# Patient Record
Sex: Female | Born: 1980 | Race: Black or African American | Hispanic: No | Smoking: Never smoker
Health system: Southern US, Community
[De-identification: ages and names within clinical notes are randomized; demographics above are authoritative.]

## PROBLEM LIST (undated history)

## (undated) DIAGNOSIS — J302 Other seasonal allergic rhinitis: Secondary | ICD-10-CM

## (undated) HISTORY — PX: OTHER SURGICAL HISTORY: SHX169

## (undated) HISTORY — PX: HERNIA REPAIR: SHX51

---

## 2005-04-06 ENCOUNTER — Ambulatory Visit (HOSPITAL_COMMUNITY): Admission: RE | Admit: 2005-04-06 | Discharge: 2005-04-06 | Payer: Self-pay | Admitting: Orthopedic Surgery

## 2011-04-23 ENCOUNTER — Encounter: Payer: Self-pay | Admitting: Emergency Medicine

## 2011-04-23 ENCOUNTER — Emergency Department (HOSPITAL_BASED_OUTPATIENT_CLINIC_OR_DEPARTMENT_OTHER)
Admission: EM | Admit: 2011-04-23 | Discharge: 2011-04-23 | Disposition: A | Discharge status: Home / Self Care | Payer: BC Managed Care – PPO | Attending: Emergency Medicine | Admitting: Emergency Medicine

## 2011-04-23 DIAGNOSIS — J029 Acute pharyngitis, unspecified: Secondary | ICD-10-CM | POA: Insufficient documentation

## 2011-04-23 DIAGNOSIS — J069 Acute upper respiratory infection, unspecified: Secondary | ICD-10-CM

## 2011-04-23 DIAGNOSIS — H9209 Otalgia, unspecified ear: Secondary | ICD-10-CM | POA: Insufficient documentation

## 2011-04-23 MED ORDER — OXYCODONE-ACETAMINOPHEN 5-325 MG PO TABS
1.0000 | ORAL_TABLET | Freq: Once | ORAL | Status: AC
  Administered 2011-04-23: 1 via ORAL
  Filled 2011-04-23 (×2): qty 1

## 2011-04-23 MED ORDER — IBUPROFEN 800 MG PO TABS
800.0000 mg | ORAL_TABLET | Freq: Once | ORAL | Status: AC
  Administered 2011-04-23: 800 mg via ORAL
  Filled 2011-04-23 (×2): qty 1

## 2011-04-23 NOTE — ED Provider Notes (Signed)
History     CSN: [id number] Arrival date & time: 04/23/2011  1:06 PM Pt seen at 1322 Chief Complaint  Patient presents with  . Otalgia    earache with sore throat and chest congestion    (Consider location/radiation/quality/duration/timing/severity/associated sxs/prior treatment) Patient is a 30 y.o. female presenting with ear pain. The history is provided by the patient.  Otalgia This is a new problem. The current episode started 2 days ago. There is pain in both ears. The problem has been gradually worsening. Associated symptoms include sore throat and cough.  pt reports cough/congestion/sore throat and ear pain  History reviewed. No pertinent past medical history.  Past Surgical History  Procedure Date  . Knee sugery     History reviewed. No pertinent family history.  History  Substance Use Topics  . Smoking status: Never Smoker   . Smokeless tobacco: Not on file  . Alcohol Use: No    OB History    Grav Para Term Preterm Abortions TAB SAB Ect Mult Living                  Review of Systems  HENT: Positive for ear pain and sore throat.   Respiratory: Positive for cough.     Allergies  Review of patient's allergies indicates no known allergies.  Home Medications  No current outpatient prescriptions on file.  BP 122/74  Pulse 66  Temp 98.9 F (37.2 C)  Resp 18  SpO2 100%  LMP 03/31/2011  Physical Exam CONSTITUTIONAL: Well developed/well nourished HEAD AND FACE: Normocephalic/atraumatic EYES: EOMI/PERRL ENMT: Mucous membranes moist, uvula midline, pharynx normal Left TM/right TM normal NECK: supple no meningeal signs CV: S1/S2 noted, no murmurs/rubs/gallops noted LUNGS: Lungs are clear to auscultation bilaterally, no apparent distress ABDOMEN: soft, nontender, no rebound or guarding NEURO: Pt is awake/alert, moves all extremitiesx4 EXTREMITIES: pulses normal, full ROM SKIN: warm, color normal   ED Course  Procedures (including critical care  time)   Labs Reviewed  RAPID STREP SCREEN     1. Upper respiratory infection   2. Pharyngitis       MDM  Nursing notes reviewed and considered in documentation All labs/vitals reviewed and considered         Joya Gaskins, MD 04/23/11 1404

## 2011-04-23 NOTE — ED Notes (Signed)
Pt presents with earache sore throat and chest congestion

## 2011-04-23 NOTE — ED Notes (Signed)
Care Plan and hydration reviewed with Pt and family at side

## 2012-05-30 ENCOUNTER — Other Ambulatory Visit (HOSPITAL_COMMUNITY)
Admission: RE | Admit: 2012-05-30 | Discharge: 2012-05-30 | Disposition: A | Discharge status: Home / Self Care | Payer: Self-pay | Source: Ambulatory Visit | Attending: Obstetrics and Gynecology | Admitting: Obstetrics and Gynecology

## 2012-05-30 DIAGNOSIS — Z01419 Encounter for gynecological examination (general) (routine) without abnormal findings: Secondary | ICD-10-CM | POA: Insufficient documentation

## 2012-05-30 DIAGNOSIS — Z1151 Encounter for screening for human papillomavirus (HPV): Secondary | ICD-10-CM | POA: Insufficient documentation

## 2012-05-30 DIAGNOSIS — R87810 Cervical high risk human papillomavirus (HPV) DNA test positive: Secondary | ICD-10-CM | POA: Insufficient documentation

## 2013-06-04 ENCOUNTER — Other Ambulatory Visit: Payer: Self-pay | Admitting: Obstetrics and Gynecology

## 2013-06-04 ENCOUNTER — Other Ambulatory Visit (HOSPITAL_COMMUNITY)
Admission: RE | Admit: 2013-06-04 | Discharge: 2013-06-04 | Disposition: A | Discharge status: Home / Self Care | Payer: BC Managed Care – PPO | Source: Ambulatory Visit | Attending: Obstetrics and Gynecology | Admitting: Obstetrics and Gynecology

## 2013-06-04 DIAGNOSIS — R87810 Cervical high risk human papillomavirus (HPV) DNA test positive: Secondary | ICD-10-CM | POA: Insufficient documentation

## 2013-06-04 DIAGNOSIS — Z1151 Encounter for screening for human papillomavirus (HPV): Secondary | ICD-10-CM | POA: Insufficient documentation

## 2013-06-04 DIAGNOSIS — Z124 Encounter for screening for malignant neoplasm of cervix: Secondary | ICD-10-CM | POA: Insufficient documentation

## 2015-12-27 ENCOUNTER — Telehealth (HOSPITAL_COMMUNITY): Payer: Self-pay | Admitting: *Deleted

## 2015-12-27 NOTE — Telephone Encounter (Signed)
Called patient to extend an invitation for a [employer] on June 21. Left message and asked for a return phone call.

## 2016-09-09 ENCOUNTER — Encounter (HOSPITAL_BASED_OUTPATIENT_CLINIC_OR_DEPARTMENT_OTHER): Payer: Self-pay | Admitting: Emergency Medicine

## 2016-09-09 ENCOUNTER — Emergency Department (HOSPITAL_BASED_OUTPATIENT_CLINIC_OR_DEPARTMENT_OTHER)
Admission: EM | Admit: 2016-09-09 | Discharge: 2016-09-09 | Disposition: A | Discharge status: Home / Self Care | Payer: BLUE CROSS/BLUE SHIELD | Attending: Physician Assistant | Admitting: Physician Assistant

## 2016-09-09 DIAGNOSIS — R197 Diarrhea, unspecified: Secondary | ICD-10-CM | POA: Insufficient documentation

## 2016-09-09 DIAGNOSIS — R112 Nausea with vomiting, unspecified: Secondary | ICD-10-CM | POA: Diagnosis present

## 2016-09-09 DIAGNOSIS — R109 Unspecified abdominal pain: Secondary | ICD-10-CM | POA: Diagnosis not present

## 2016-09-09 LAB — COMPREHENSIVE METABOLIC PANEL
ALBUMIN: 3.8 g/dL (ref 3.5–5.0)
ALT: 39 U/L (ref 14–54)
AST: 28 U/L (ref 15–41)
Alkaline Phosphatase: 58 U/L (ref 38–126)
Anion gap: 8 (ref 5–15)
BUN: 23 mg/dL — AB (ref 6–20)
CHLORIDE: 104 mmol/L (ref 101–111)
CO2: 24 mmol/L (ref 22–32)
CREATININE: 1.01 mg/dL — AB (ref 0.44–1.00)
Calcium: 8.7 mg/dL — ABNORMAL LOW (ref 8.9–10.3)
GFR calc Af Amer: >60 mL/min (ref >60)
GLUCOSE: 103 mg/dL — AB (ref 65–99)
Potassium: 3.5 mmol/L (ref 3.5–5.1)
Sodium: 136 mmol/L (ref 135–145)
Total Bilirubin: 1.1 mg/dL (ref 0.3–1.2)
Total Protein: 8.6 g/dL — ABNORMAL HIGH (ref 6.5–8.1)

## 2016-09-09 LAB — URINALYSIS, ROUTINE W REFLEX MICROSCOPIC
GLUCOSE, UA: NEGATIVE mg/dL
Hgb urine dipstick: NEGATIVE
KETONES UR: 15 mg/dL — AB
NITRITE: NEGATIVE
PROTEIN: NEGATIVE mg/dL
Specific Gravity, Urine: 1.03 (ref 1.005–1.030)
pH: 6 (ref 5.0–8.0)

## 2016-09-09 LAB — CBC
HCT: 43.4 % (ref 36.0–46.0)
Hemoglobin: 15.2 g/dL — ABNORMAL HIGH (ref 12.0–15.0)
MCH: 29.7 pg (ref 26.0–34.0)
MCHC: 35 g/dL (ref 30.0–36.0)
MCV: 84.8 fL (ref 78.0–100.0)
Platelets: 281 10*3/uL (ref 150–400)
RBC: 5.12 MIL/uL — ABNORMAL HIGH (ref 3.87–5.11)
RDW: 12.8 % (ref 11.5–15.5)
WBC: 7.3 10*3/uL (ref 4.0–10.5)

## 2016-09-09 LAB — URINALYSIS, MICROSCOPIC (REFLEX): RBC / HPF: NONE SEEN RBC/hpf (ref 0–5)

## 2016-09-09 LAB — LIPASE, BLOOD: LIPASE: 22 U/L (ref 11–51)

## 2016-09-09 LAB — PREGNANCY, URINE: Preg Test, Ur: NEGATIVE

## 2016-09-09 MED ORDER — ONDANSETRON HCL 4 MG PO TABS: 4.0000 mg | ORAL_TABLET | Freq: Three times a day (TID) | ORAL | 0 refills | Status: AC | PRN

## 2016-09-09 MED ORDER — SODIUM CHLORIDE 0.9 % IV BOLUS (SEPSIS)
1000.0000 mL | Freq: Once | INTRAVENOUS | Status: AC
  Administered 2016-09-09: 1000 mL via INTRAVENOUS

## 2016-09-09 MED ORDER — ONDANSETRON HCL 4 MG/2ML IJ SOLN
4.0000 mg | Freq: Once | INTRAMUSCULAR | Status: AC | PRN
  Administered 2016-09-09: 4 mg via INTRAVENOUS
  Filled 2016-09-09: qty 2

## 2016-09-09 NOTE — ED Notes (Signed)
Patient denies pain and is resting comfortably.  

## 2016-09-09 NOTE — ED Triage Notes (Signed)
Woke up this am with n/v/d and abd cramping

## 2016-09-09 NOTE — ED Provider Notes (Signed)
[facility]   CSN: [id number] Arrival date & time: 09/09/16  1243  By signing my name below, I, Modena JanskyAlbert Thayil, attest that this documentation has been prepared under the direction and in the presence of Courteney Randall AnLyn Mackuen, MD. Electronically Signed: Modena JanskyAlbert Thayil, Scribe. 09/09/2016. 3:12 PM.  History   Chief Complaint Chief Complaint  Patient presents with  . Abdominal Pain  . Emesis   The history is provided by the patient. No language interpreter was used.   HPI Comments: Sharon Hampton is a 36 y.o. female who presents to the Emergency Department complaining of intermittent vomiting that started this morning. She had a sudden onset of symptoms from suspected food last night. No modifying factors. She reports associated nausea, diarrhea, and abdominal pain (cramping sensation). She denies any other complaints.   History reviewed. No pertinent past medical history.  There are no active problems to display for this patient.   Past Surgical History:  Procedure Laterality Date  . knee sugery      OB History    No data available       Home Medications    Prior to Admission medications   Not on File    Family History No family history on file.  Social History Social History  Substance Use Topics  . Smoking status: Never Smoker  . Smokeless tobacco: Never Used  . Alcohol use No     Allergies   Erythromycin   Review of Systems Review of Systems  Gastrointestinal: Positive for abdominal pain, diarrhea, nausea and vomiting.  All other systems reviewed and are negative.    Physical Exam Updated Vital Signs BP 139/74 (BP Location: Right Arm)   Pulse 83   Temp 98.4 F (36.9 C) (Oral)   Resp 20   Ht 5' 4.5" (1.638 m)   Wt 159 lb (72.1 kg)   LMP 08/23/2016   SpO2 100%   BMI 26.87 kg/m   Physical Exam  Constitutional: She appears well-developed and well-nourished. No distress.  HENT:  Head: Normocephalic.  Eyes:  Conjunctivae are normal.  Neck: Neck supple.  Cardiovascular: Normal rate and regular rhythm.   Pulmonary/Chest: Effort normal.  Abdominal: Soft. She exhibits no distension. There is no tenderness.  Musculoskeletal: Normal range of motion.  Neurological: She is alert.  Skin: Skin is warm and dry.  Psychiatric: She has a normal mood and affect.  Nursing note and vitals reviewed.    ED Treatments / Results  DIAGNOSTIC STUDIES: Oxygen Saturation is 100% on RA, normal by my interpretation.    COORDINATION OF CARE: 3:16 PM- Pt advised of plan for treatment and pt agrees.  Labs (all labs ordered are listed, but only abnormal results are displayed) Labs Reviewed  URINALYSIS, ROUTINE W REFLEX MICROSCOPIC - Abnormal; Notable for the following:       Result Value   Color, Urine AMBER (*)    Bilirubin Urine SMALL (*)    Ketones, ur 15 (*)    Leukocytes, UA SMALL (*)    All other components within normal limits  URINALYSIS, MICROSCOPIC (REFLEX) - Abnormal; Notable for the following:    Bacteria, UA MANY (*)    Squamous Epithelial / LPF 6-30 (*)    All other components within normal limits  PREGNANCY, URINE  LIPASE, BLOOD  COMPREHENSIVE METABOLIC PANEL  CBC    EKG  EKG Interpretation None       Radiology No results found.  Procedures Procedures (including critical care time)  Medications Ordered  in ED Medications  ondansetron (ZOFRAN) injection 4 mg (4 mg Intravenous Given 09/09/16 1511)  sodium chloride 0.9 % bolus 1,000 mL (1,000 mLs Intravenous New Bag/Given 09/09/16 1455)     Initial Impression / Assessment and Plan / ED Course  I have reviewed the triage vital signs and the nursing notes.  Pertinent labs & imaging results that were available during my care of the patient were reviewed by me and considered in my medical decision making (see chart for details).     I personally performed the services described in this documentation, which was scribed in my  presence. The recorded information has been reviewed and is accurate.   Patient is a pleasant 36 year old female who presents with nausea vomiting diarrhea after eating at [facility] last night. Patient's husband has the same thing. I suspect foodborne illness versus viral gastroenteritis. We'll give fluids, Zofran, PO challenge and discharge home with symptomatic care. Patient's physical exam and vital signs are reassuring.   Final Clinical Impressions(s) / ED Diagnoses   Final diagnoses:  None    New Prescriptions New Prescriptions   No medications on file      Courteney Randall An, MD 09/09/16 1534

## 2016-09-09 NOTE — ED Notes (Signed)
Pt able to tolerate water with no n/v. 

## 2017-04-16 ENCOUNTER — Emergency Department (HOSPITAL_BASED_OUTPATIENT_CLINIC_OR_DEPARTMENT_OTHER): Payer: BLUE CROSS/BLUE SHIELD

## 2017-04-16 ENCOUNTER — Encounter (HOSPITAL_BASED_OUTPATIENT_CLINIC_OR_DEPARTMENT_OTHER): Payer: Self-pay | Admitting: Emergency Medicine

## 2017-04-16 ENCOUNTER — Emergency Department (HOSPITAL_BASED_OUTPATIENT_CLINIC_OR_DEPARTMENT_OTHER)
Admission: EM | Admit: 2017-04-16 | Discharge: 2017-04-16 | Disposition: A | Discharge status: Home / Self Care | Payer: BLUE CROSS/BLUE SHIELD | Attending: Emergency Medicine | Admitting: Emergency Medicine

## 2017-04-16 DIAGNOSIS — J029 Acute pharyngitis, unspecified: Secondary | ICD-10-CM | POA: Diagnosis present

## 2017-04-16 DIAGNOSIS — J209 Acute bronchitis, unspecified: Secondary | ICD-10-CM | POA: Insufficient documentation

## 2017-04-16 HISTORY — DX: Other seasonal allergic rhinitis: J30.2

## 2017-04-16 LAB — RAPID STREP SCREEN (MED CTR MEBANE ONLY): Streptococcus, Group A Screen (Direct): NEGATIVE

## 2017-04-16 MED ORDER — IBUPROFEN 400 MG PO TABS
600.0000 mg | ORAL_TABLET | Freq: Once | ORAL | Status: AC
  Administered 2017-04-16: 600 mg via ORAL
  Filled 2017-04-16: qty 1

## 2017-04-16 MED ORDER — DOXYCYCLINE HYCLATE 100 MG PO CAPS: 100.0000 mg | ORAL_CAPSULE | Freq: Two times a day (BID) | ORAL | 0 refills | Status: AC

## 2017-04-16 MED ORDER — ALBUTEROL SULFATE HFA 108 (90 BASE) MCG/ACT IN AERS
2.0000 | INHALATION_SPRAY | Freq: Once | RESPIRATORY_TRACT | Status: AC
  Administered 2017-04-16: 2 via RESPIRATORY_TRACT
  Filled 2017-04-16: qty 6.7

## 2017-04-16 MED FILL — DOXYCYCLINE HYC 100 MG CAP: 100 | 7 days supply | Qty: 14 | Fill #0

## 2017-04-16 NOTE — ED Notes (Signed)
ED Provider at bedside. 

## 2017-04-16 NOTE — ED Notes (Signed)
Patient transported to X-ray 

## 2017-04-16 NOTE — ED Provider Notes (Signed)
[facility]   CSN: [id number] Arrival date & time: 04/16/17  0901     History   Chief Complaint Chief Complaint  Patient presents with  . Sore Throat    HPI Sharon Hampton is a 36 y.o. female.  HPI Patient is a 36 year old female presents with ongoing discomfort in her throat over the past 7-9 days which she now feels like his worsening.  She's also having some cough and pleuritic upper right chest pain.  No fevers or chills.  No neck stiffness.  She is able to drink and swallow.  No shortness of breath or difficulty breathing.  Symptoms are mild in severity.  No other complaints.   Past Medical History:  Diagnosis Date  . Seasonal allergies     There are no active problems to display for this patient.   Past Surgical History:  Procedure Laterality Date  . knee sugery      OB History    No data available       Home Medications    Prior to Admission medications   Medication Sig Start Date End Date Taking? Authorizing Provider  doxycycline (VIBRAMYCIN) 100 MG capsule Take 1 capsule (100 mg total) by mouth 2 (two) times daily. 04/16/17   Azalia Bilis, MD  ondansetron (ZOFRAN) 4 MG tablet Take 1 tablet (4 mg total) by mouth every 8 (eight) hours as needed for nausea or vomiting. 09/09/16   Mackuen, Cindee Salt, MD    Family History No family history on file.  Social History Social History  Substance Use Topics  . Smoking status: Never Smoker  . Smokeless tobacco: Never Used  . Alcohol use No     Allergies   Erythromycin   Review of Systems Review of Systems  All other systems reviewed and are negative.    Physical Exam Updated Vital Signs BP 121/62 (BP Location: Right Arm)   Pulse 72   Temp 98.3 F (36.8 C) (Oral)   Resp 18   Ht  (1.626 m)   Wt 70.3 kg (155 lb)   LMP 03/26/2017   SpO2 97%   BMI 26.61 kg/m   Physical Exam  Constitutional: She is oriented to person, place, and time. She appears  well-developed and well-nourished.  HENT:  Head: Normocephalic.  Uvula midline.  Mild erythema posterior pharynx.  Tolerating secretions.  Oral airway patent.  Mild tonsillar swelling without exudates.  Eyes: EOM are normal.  Neck: Normal range of motion. Neck supple. No tracheal deviation present. No thyromegaly present.  No meningeal signs  Pulmonary/Chest: Effort normal.  Abdominal: She exhibits no distension.  Musculoskeletal: Normal range of motion.  Lymphadenopathy:    She has no cervical adenopathy.  Neurological: She is alert and oriented to person, place, and time.  Psychiatric: She has a normal mood and affect.  Nursing note and vitals reviewed.    ED Treatments / Results  Labs (all labs ordered are listed, but only abnormal results are displayed) Labs Reviewed  RAPID STREP SCREEN (NOT AT [facility])  CULTURE, GROUP A STREP [facility])    EKG  EKG Interpretation None       Radiology Dg Chest 2 View  Result Date: 04/16/2017 CLINICAL DATA:  Chest pain. EXAM: CHEST  2 VIEW COMPARISON:  None. FINDINGS: The heart size and mediastinal contours are within normal limits. Both lungs are clear. No pneumothorax or pleural effusion is noted. The visualized skeletal structures are unremarkable. IMPRESSION: No active cardiopulmonary disease. Electronically Signed  By: Lupita Raider, M.D.   On: 04/16/2017 09:35    Procedures Procedures (including critical care time)  Medications Ordered in ED Medications  albuterol (PROVENTIL HFA;VENTOLIN HFA) 108 (90 Base) MCG/ACT inhaler 2 puff (2 puffs Inhalation Given 04/16/17 0932)  ibuprofen (ADVIL,MOTRIN) tablet 600 mg (600 mg Oral Given 04/16/17 0924)     Initial Impression / Assessment and Plan / ED Course  I have reviewed the triage vital signs and the nursing notes.  Pertinent labs & imaging results that were available during my care of the patient were reviewed by me and considered in my medical decision making (see chart for  details).     Likely viral process however given persistence of symptoms we will place on course of antibiotics.  Patient understands return to the ER for new or worsening symptoms  Final Clinical Impressions(s) / ED Diagnoses   Final diagnoses:  Acute bronchitis, unspecified organism    New Prescriptions Discharge Medication List as of 04/16/2017  9:57 AM    START taking these medications   Details  doxycycline (VIBRAMYCIN) 100 MG capsule Take 1 capsule (100 mg total) by mouth 2 (two) times daily., Starting Mon 04/16/2017, Print         Azalia Bilis, MD 04/16/17 1110

## 2017-04-16 NOTE — ED Triage Notes (Signed)
Sore throat x 2 weeks with chest congestion.

## 2017-04-17 LAB — CULTURE, GROUP A STREP (THRC)

## 2017-05-29 ENCOUNTER — Emergency Department (HOSPITAL_BASED_OUTPATIENT_CLINIC_OR_DEPARTMENT_OTHER)
Admission: EM | Admit: 2017-05-29 | Discharge: 2017-05-29 | Disposition: A | Discharge status: Home / Self Care | Payer: BLUE CROSS/BLUE SHIELD | Attending: Emergency Medicine | Admitting: Emergency Medicine

## 2017-05-29 ENCOUNTER — Other Ambulatory Visit: Payer: Self-pay

## 2017-05-29 ENCOUNTER — Encounter (HOSPITAL_BASED_OUTPATIENT_CLINIC_OR_DEPARTMENT_OTHER): Payer: Self-pay | Admitting: *Deleted

## 2017-05-29 DIAGNOSIS — S50862A Insect bite (nonvenomous) of left forearm, initial encounter: Secondary | ICD-10-CM | POA: Insufficient documentation

## 2017-05-29 DIAGNOSIS — Y998 Other external cause status: Secondary | ICD-10-CM | POA: Insufficient documentation

## 2017-05-29 DIAGNOSIS — W57XXXA Bitten or stung by nonvenomous insect and other nonvenomous arthropods, initial encounter: Secondary | ICD-10-CM | POA: Insufficient documentation

## 2017-05-29 DIAGNOSIS — Y929 Unspecified place or not applicable: Secondary | ICD-10-CM | POA: Diagnosis not present

## 2017-05-29 DIAGNOSIS — Y9389 Activity, other specified: Secondary | ICD-10-CM | POA: Diagnosis not present

## 2017-05-29 MED ORDER — EPINEPHRINE 0.15 MG/0.15ML IJ SOAJ: 0.1500 mg | INTRAMUSCULAR | 0 refills | Status: AC | PRN

## 2017-05-29 MED ORDER — CEPHALEXIN 500 MG PO CAPS: 500.0000 mg | ORAL_CAPSULE | Freq: Four times a day (QID) | ORAL | 0 refills | Status: DC

## 2017-05-29 MED FILL — CEPHALEXIN 500 MG CAPSULE: 500 | 5 days supply | Qty: 20 | Fill #0

## 2017-05-29 NOTE — ED Provider Notes (Signed)
[facility] EMERGENCY DEPARTMENT Provider Note   CSN: [id number] Arrival date & time: 05/29/17  1246     History   Chief Complaint Chief Complaint  Patient presents with  . Insect Bite    HPI Sharon FredricksonJessica Hampton is a 36 y.o. 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  Patient reports she was stung around 2 PM yesterday by a flying insect.  She is unsure what it was.  She smacked it on her arm.  She initially had about a dime size area of swelling and redness, but has increased over the past 24 hours.  The area has been warm she is having burning pain and itching.  She is taking Benadryl and use an anti-itch cream over-the-counter without significant relief.  She denies any fevers, difficulty breathing, swelling of her lips, tongue, throat.  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  She does not have an EpiPen anymore.  HPI  Past Medical History:  Diagnosis Date  . Seasonal allergies     There are no active problems to display for this patient.   Past Surgical History:  Procedure Laterality Date  . knee sugery      OB History    No data available       Home Medications    Prior to Admission medications   Medication Sig Start Date End Date Taking? Authorizing Provider  cephALEXin (KEFLEX) 500 MG capsule Take 1 capsule (500 mg total) 4 (four) times daily by mouth. 05/29/17   Conner Muegge, Waylan BogaAlexandra M, PA-C  doxycycline (VIBRAMYCIN) 100 MG capsule Take 1 capsule (100 mg total) by mouth 2 (two) times daily. 04/16/17   Azalia Bilisampos, Kevin, MD  EPINEPHrine 0.15 MG/0.15ML IJ injection Inject 0.15 mLs (0.15 mg total) as needed into the muscle for anaphylaxis. 05/29/17   Karlita Lichtman, Waylan BogaAlexandra M, PA-C  ondansetron (ZOFRAN) 4 MG tablet Take 1 tablet (4 mg total) by mouth every 8 (eight) hours as needed for nausea or vomiting. 09/09/16   Mackuen, Cindee Saltourteney  Lyn, MD    Family History No family history on file.  Social History Social History   Tobacco Use  . Smoking status: Never Smoker  . Smokeless tobacco: Never Used  Substance Use Topics  . Alcohol use: No  . Drug use: No     Allergies   Erythromycin   Review of Systems Review of Systems  Constitutional: Negative for fever.  HENT: Negative for facial swelling, sore throat and trouble swallowing.   Respiratory: Negative for shortness of breath.   Skin: Positive for color change and rash.     Physical Exam Updated Vital Signs BP 137/85 (BP Location: Right Arm)   Pulse 73   Temp 98.3 F (36.8 C) (Oral)   Resp 18   Ht 5' 4.5" (1.638 m)   Wt 70.3 kg (155 lb)   LMP 05/16/2017   SpO2 100%   BMI 26.19 kg/m   Physical Exam  Constitutional: She appears well-developed and well-nourished. No distress.  HENT:  Head: Normocephalic and atraumatic.  Mouth/Throat: Oropharynx is clear and moist. No oropharyngeal exudate.  Eyes: Conjunctivae are normal. Pupils are equal, round, and reactive to light. Right eye exhibits no discharge. Left eye exhibits no discharge. No scleral icterus.  Neck: Normal range of motion. Neck supple. No thyromegaly present.  Cardiovascular: Normal rate, regular rhythm, normal heart sounds  and intact distal pulses. Exam reveals no gallop and no friction rub.  No murmur heard. Pulmonary/Chest: Effort normal and breath sounds normal. No stridor. No respiratory distress. She has no wheezes. She has no rales.  Musculoskeletal: She exhibits no edema.  Lymphadenopathy:    She has no cervical adenopathy.  Neurological: She is alert. Coordination normal.  Skin: Skin is warm and dry. No rash noted. She is not diaphoretic. No pallor.  ~7cm area oval area of erythema, mild edema, and mild tenderness to L volar forearm just distal to elbow; central punctate  Psychiatric: She has a normal mood and affect.  Nursing note and vitals reviewed.    ED Treatments /  Results  Labs (all labs ordered are listed, but only abnormal results are displayed) Labs Reviewed - No data to display  EKG  EKG Interpretation None       Radiology No results found.  Procedures Procedures (including critical care time)  Medications Ordered in ED Medications - No data to display   Initial Impression / Assessment and Plan / ED Course  I have reviewed the triage vital signs and the nursing notes.  Pertinent labs & imaging results that were available during my care of the patient were reviewed by me and considered in my medical decision making (see chart for details).     Patient presentation consistent with insect bite reaction versus early cellulitis. Afebrile. No tachycardia, hypotension or other symptoms suggestive of severe infection. Area has been demarcated and pt advised to follow up for wound check in 2-3 days if not improved, sooner for worsening systemic symptoms, new lymphangitis, or significant spread of erythema past line of demarcation. Will discharge with Keflex and advised to continue Benadryl.  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  She does not have an EpiPen at home and I feel that it is unclear if she has grown out of the allergy.  Return precautions discussed.  Patient understands and agrees with plan.  Patient vitals stable throughout ED course and discharged in satisfactory condition.  Final Clinical Impressions(s) / ED Diagnoses   Final diagnoses:  Insect bite, initial encounter    ED Discharge Orders        Ordered    cephALEXin (KEFLEX) 500 MG capsule  4 times daily     05/29/17 1433    EPINEPHrine 0.15 MG/0.15ML IJ injection  As needed     05/29/17 1433       Zanyla Klebba, Waylan Bogalexandra M, PA-C 05/30/17 1840    Tilden Fossaees, Elizabeth, MD 06/01/17 1006

## 2017-05-29 NOTE — ED Notes (Signed)
Area of erythema marked with skin marker to monitor for signs or worsening.

## 2017-05-29 NOTE — Discharge Instructions (Signed)
Medications: Keflex, EpiPen  Treatment: Take Keflex 4 times daily for 5 days.  Make sure to finish all this medication.  Just in case you may still have a severe allergy to bee stings, have an EpiPen on hand for any further insect bites causing you to have difficulty breathing, swelling of your lips, tongue, throat, or vomiting.  Follow-up: Please return to the emergency department or see your doctor immediately if you develop significant increase in redness spreading past the demarcated area, swelling, drainage, red streaking up the arm.

## 2017-05-29 NOTE — ED Triage Notes (Signed)
States an insect bit her last night and she smacked it. Swelling, redness and hot to touch her left forearm.

## 2018-08-30 IMAGING — CR DG CHEST 2V
2 series · 2 of 2 positions shown · non-contrast
Comparison: None.

CLINICAL DATA: Chest pain.

EXAM:
CHEST  2 VIEW

[w chest pa]
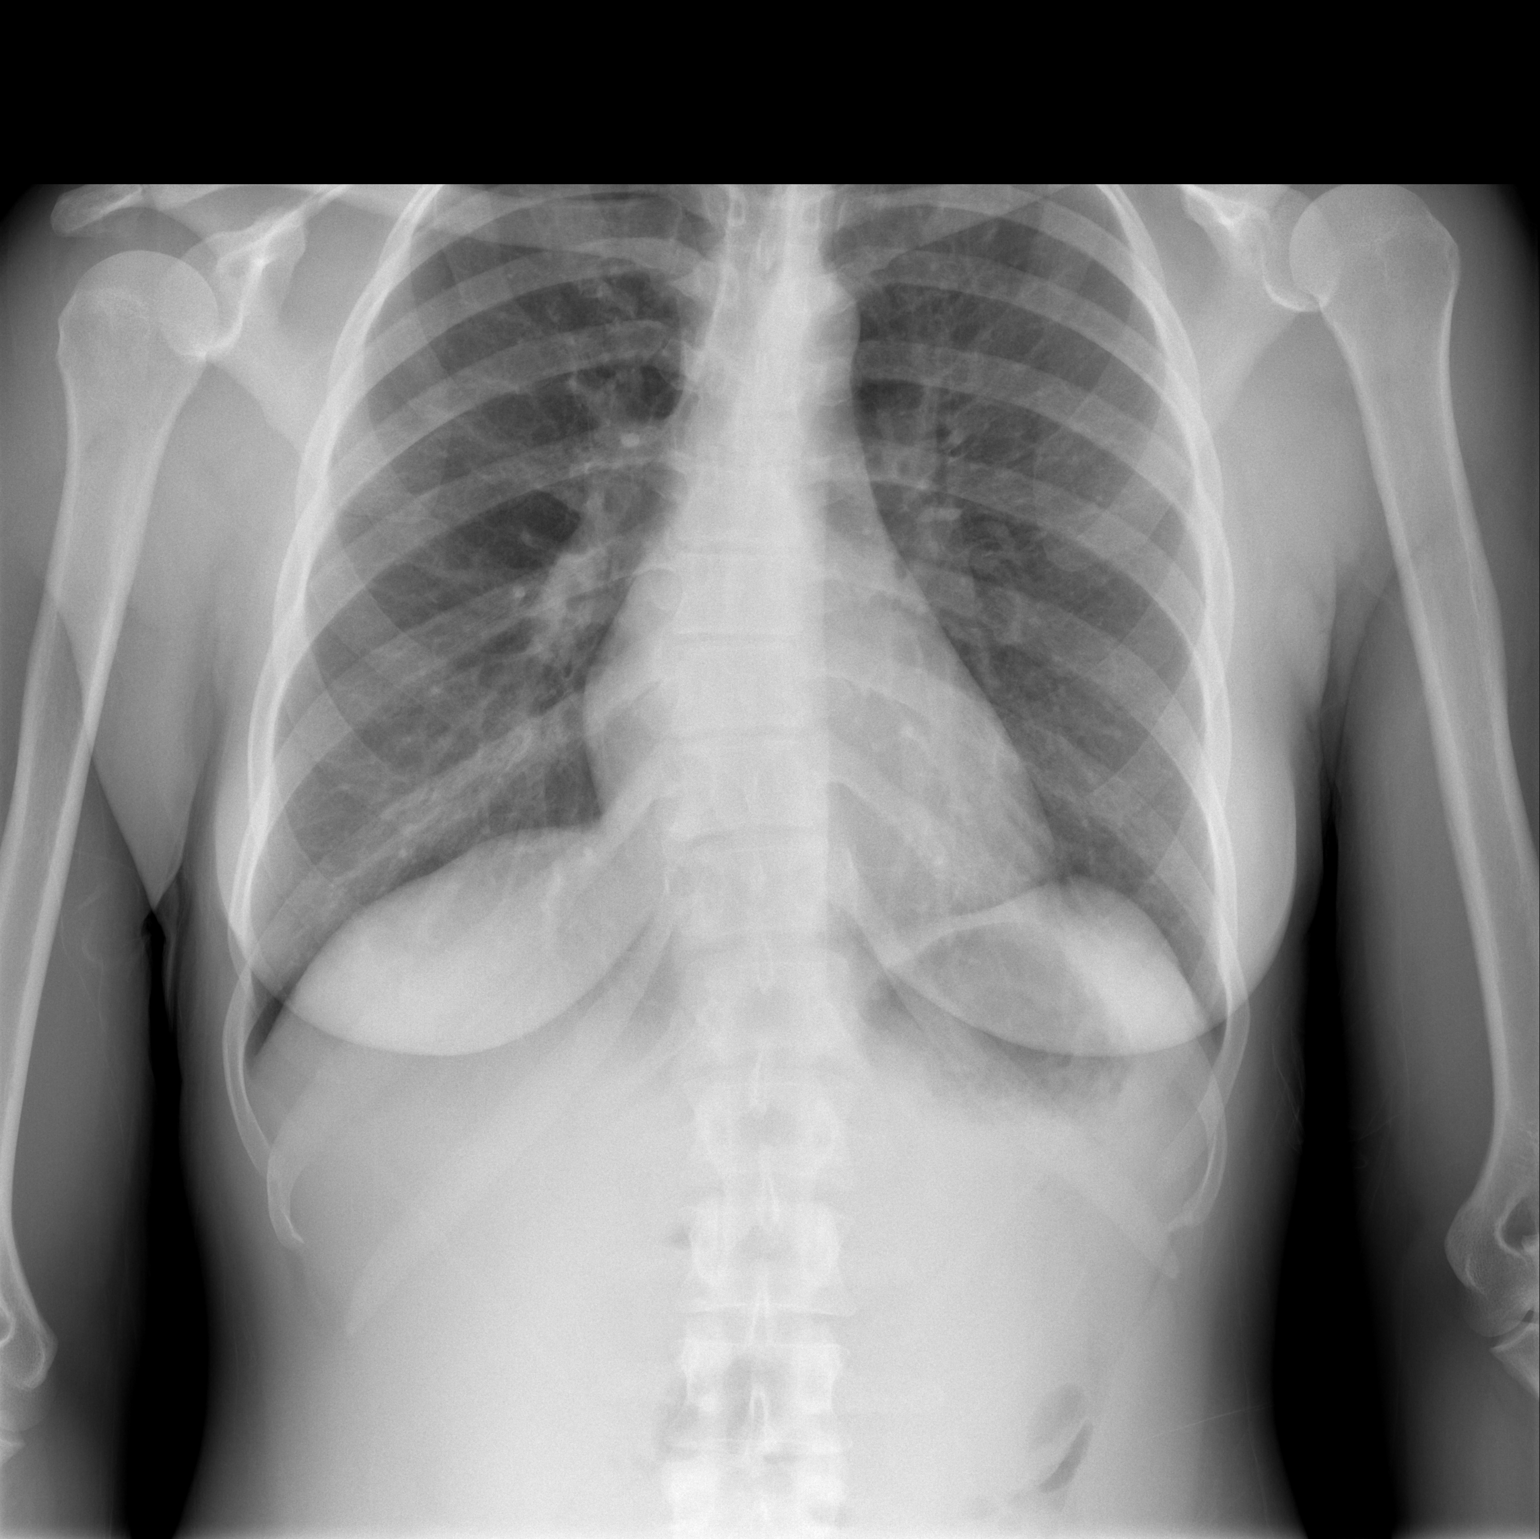

[w chest lat]
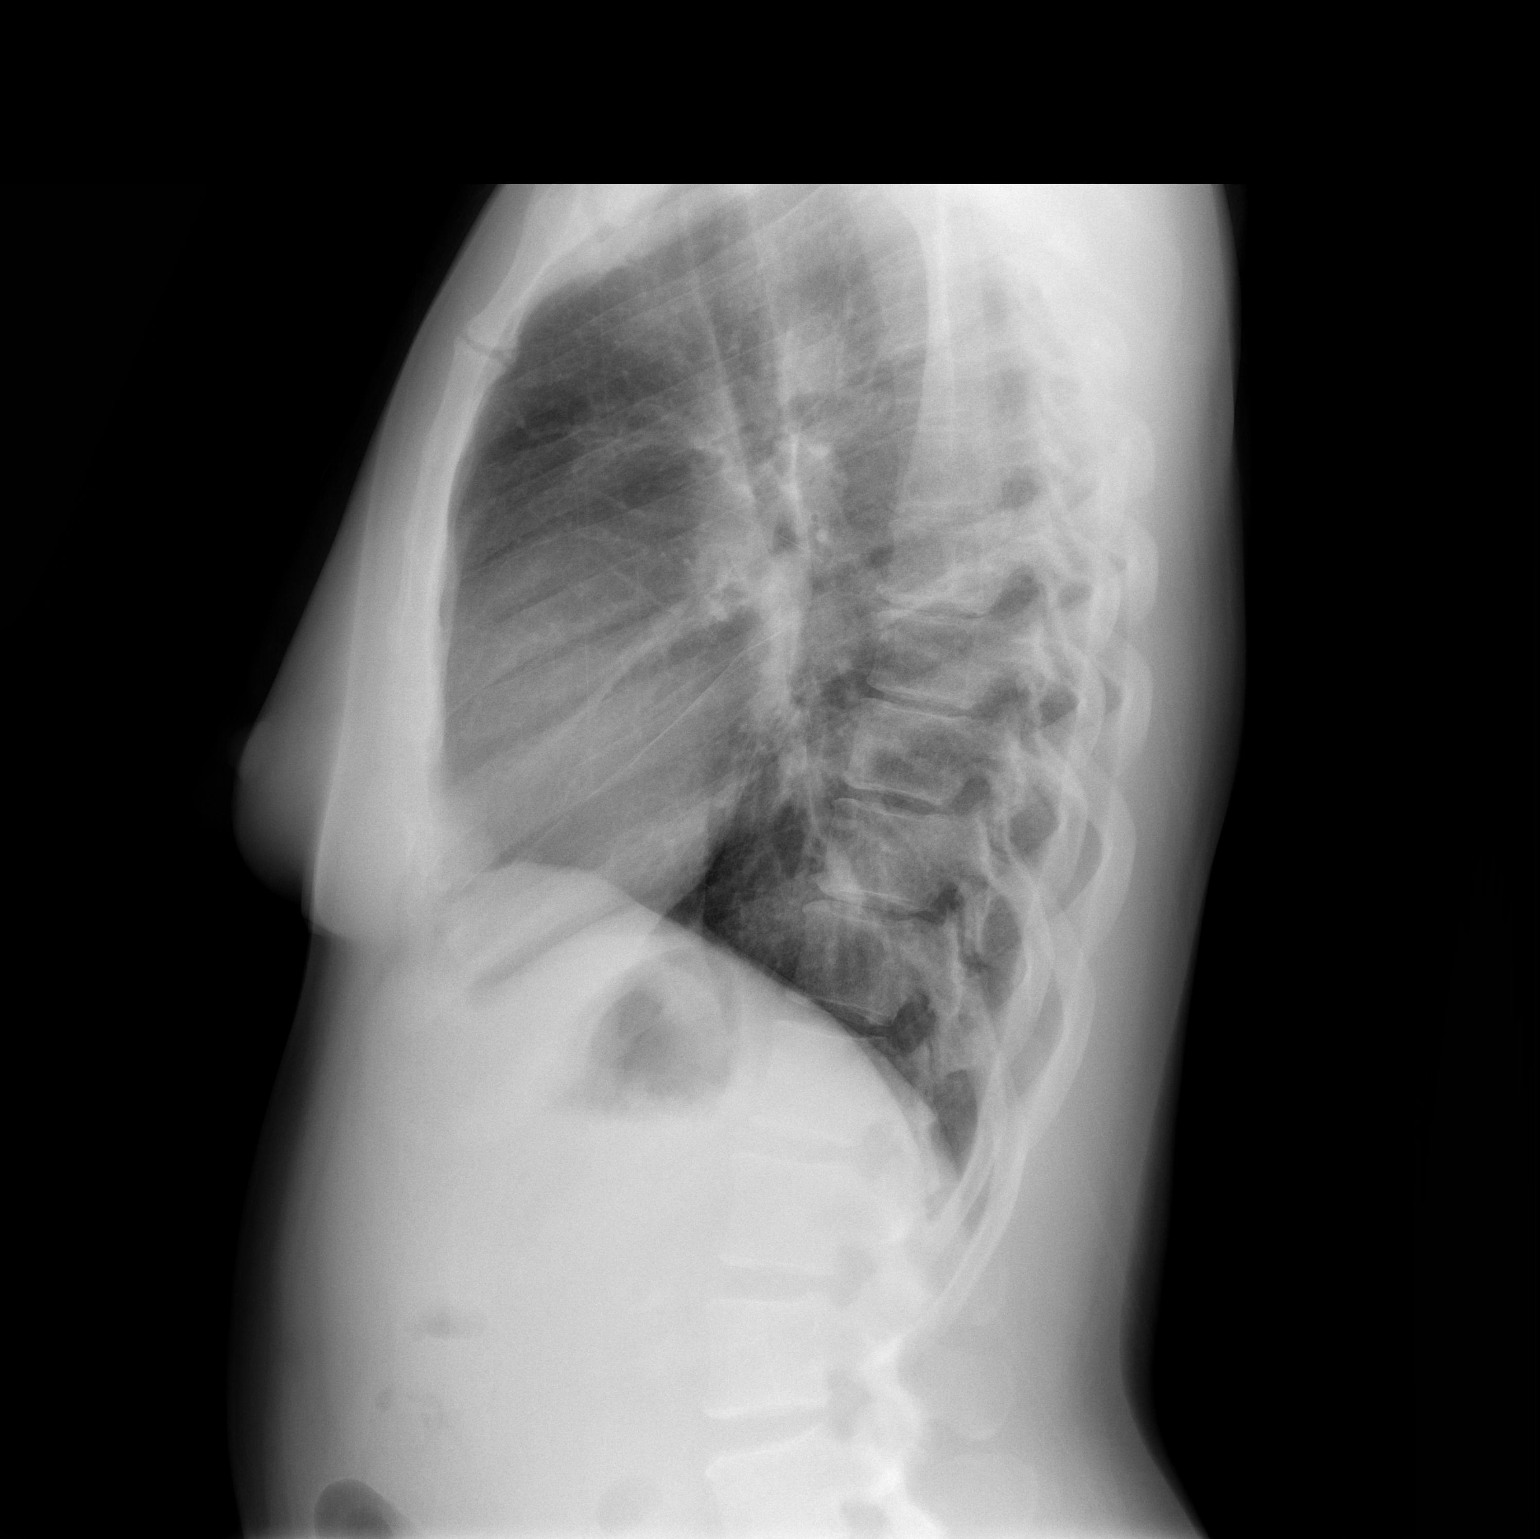

[2 of 2 positions shown; findings below may reference images not displayed]

FINDINGS: The heart size and mediastinal contours are within normal limits.
Both lungs are clear. No pneumothorax or pleural effusion is noted.
The visualized skeletal structures are unremarkable.
IMPRESSION: No active cardiopulmonary disease.

## 2020-12-10 ENCOUNTER — Other Ambulatory Visit: Payer: Self-pay

## 2020-12-10 ENCOUNTER — Encounter (HOSPITAL_BASED_OUTPATIENT_CLINIC_OR_DEPARTMENT_OTHER): Payer: Self-pay

## 2020-12-10 ENCOUNTER — Emergency Department (HOSPITAL_BASED_OUTPATIENT_CLINIC_OR_DEPARTMENT_OTHER)
Admission: EM | Admit: 2020-12-10 | Discharge: 2020-12-10 | Disposition: A | Discharge status: Home / Self Care | Payer: BLUE CROSS/BLUE SHIELD | Attending: Emergency Medicine | Admitting: Emergency Medicine

## 2020-12-10 DIAGNOSIS — M545 Low back pain, unspecified: Secondary | ICD-10-CM | POA: Insufficient documentation

## 2020-12-10 DIAGNOSIS — X500XXA Overexertion from strenuous movement or load, initial encounter: Secondary | ICD-10-CM | POA: Insufficient documentation

## 2020-12-10 MED ORDER — HYDROMORPHONE HCL 1 MG/ML IJ SOLN
1.0000 mg | Freq: Once | INTRAMUSCULAR | Status: AC
  Administered 2020-12-10: 1 mg via INTRAMUSCULAR
  Filled 2020-12-10: qty 1

## 2020-12-10 MED ORDER — KETOROLAC TROMETHAMINE 10 MG PO TABS: 10.0000 mg | ORAL_TABLET | Freq: Four times a day (QID) | ORAL | 0 refills | Status: AC | PRN

## 2020-12-10 MED ORDER — KETOROLAC TROMETHAMINE 30 MG/ML IJ SOLN
30.0000 mg | Freq: Once | INTRAMUSCULAR | Status: AC
  Administered 2020-12-10: 30 mg via INTRAMUSCULAR
  Filled 2020-12-10: qty 1

## 2020-12-10 NOTE — Discharge Instructions (Addendum)
You can take Tylenol with Toradol but do not use NSAIDs such as ibuprofen.

## 2020-12-10 NOTE — ED Triage Notes (Signed)
Pt c/o intermittent lower back pain "my butt bone" states started ~61month ago after moving furniture-NAD-to triage in w/c

## 2020-12-10 NOTE — ED Provider Notes (Signed)
[facility] EMERGENCY DEPARTMENT Provider Note   CSN: [id number] Arrival date & time: 12/10/20  1614     History Chief Complaint  Patient presents with  . Back Pain    Sharon Hampton is a 40 y.o. female.  Her pain initially started when she was lifting heavy furniture.  She bent over to lift the object, and she could not straighten back up.  Recently, her pain was increased when she climbed on a ladder to hang a heavy set of drapes.  Since that time, she has had central low back pain.  The history is provided by the patient.  Back Pain Location:  Lumbar spine Quality:  Stabbing Radiates to:  Does not radiate Pain severity:  Severe Pain is:  Same all the time Onset quality:  Gradual Duration:  1 month Timing:  Constant Progression:  Worsening Chronicity:  New Context: lifting heavy objects   Relieved by:  Nothing Worsened by:  Bending, movement, standing and ambulation Ineffective treatments:  OTC medications and NSAIDs (back brace) Associated symptoms: no abdominal pain, no bladder incontinence, no bowel incontinence, no chest pain, no dysuria, no fever, no leg pain, no numbness, no paresthesias, no perianal numbness, no weakness and no weight loss        Past Medical History:  Diagnosis Date  . Seasonal allergies     There are no problems to display for this patient.   Past Surgical History:  Procedure Laterality Date  . HERNIA REPAIR    . knee sugery       OB History   No obstetric history on file.     No family history on file.  Social History   Tobacco Use  . Smoking status: Never Smoker  . Smokeless tobacco: Never Used  Substance Use Topics  . Alcohol use: Yes    Comment: occ  . Drug use: No    Home Medications Prior to Admission medications   Medication Sig Start Date End Date Taking? Authorizing Provider  cephALEXin (KEFLEX) 500 MG capsule Take 1 capsule (500 mg total) 4 (four) times daily by mouth. 05/29/17   Law, Waylan Boga,  PA-C  doxycycline (VIBRAMYCIN) 100 MG capsule Take 1 capsule (100 mg total) by mouth 2 (two) times daily. 04/16/17   Azalia Bilis, MD  EPINEPHrine 0.15 MG/0.15ML IJ injection Inject 0.15 mLs (0.15 mg total) as needed into the muscle for anaphylaxis. 05/29/17   Law, Waylan Boga, PA-C  ondansetron (ZOFRAN) 4 MG tablet Take 1 tablet (4 mg total) by mouth every 8 (eight) hours as needed for nausea or vomiting. 09/09/16   Mackuen, Courteney Lyn, MD    Allergies    Erythromycin  Review of Systems   Review of Systems  Constitutional: Negative for chills, fever and weight loss.  HENT: Negative for ear pain and sore throat.   Eyes: Negative for pain and visual disturbance.  Respiratory: Negative for cough and shortness of breath.   Cardiovascular: Negative for chest pain and palpitations.  Gastrointestinal: Negative for abdominal pain, bowel incontinence and vomiting.  Genitourinary: Negative for bladder incontinence, dysuria and hematuria.  Musculoskeletal: Positive for back pain. Negative for arthralgias.  Skin: Negative for color change and rash.  Neurological: Negative for seizures, syncope, weakness, numbness and paresthesias.  All other systems reviewed and are negative.   Physical Exam Updated Vital Signs BP (!) 139/106 (BP Location: Left Arm)   Pulse (!) 102   Temp 99.3 F (37.4 C) (Oral)   Resp 20   LMP  11/21/2020   SpO2 98%   Physical Exam Vitals and nursing note reviewed.  Constitutional:      Comments: tearful  HENT:     Head: Normocephalic and atraumatic.  Eyes:     General: No scleral icterus. Pulmonary:     Effort: Pulmonary effort is normal. No respiratory distress.  Musculoskeletal:     Cervical back: Normal range of motion. Normal range of motion.     Thoracic back: Normal range of motion.     Lumbar back: No bony tenderness. Decreased range of motion. Positive right straight leg raise test (pain only no radicular symptoms). Negative left straight leg raise test.        Back:     Comments: Diffuse tenderness over the entire lumbar region.  It is not localized to a specific side or the midline.  Range of motion is slightly painful for flexion but largely full.  It is more painful for extension.  Hip range of motion does not provoke pain.  Skin:    General: Skin is warm and dry.  Neurological:     Mental Status: She is alert.     Sensory: Sensation is intact.     Motor: No weakness.     Comments:  Heel and toe raise intact.  Lower extremity strength normal.  Psychiatric:        Mood and Affect: Mood normal.     ED Results / Procedures / Treatments   Labs (all labs ordered are listed, but only abnormal results are displayed) Labs Reviewed - No data to display  EKG None  Radiology No results found.  Procedures Procedures   Medications Ordered in ED Medications  ketorolac (TORADOL) 30 MG/ML injection 30 mg (30 mg Intramuscular Given 12/10/20 1648)  HYDROmorphone (DILAUDID) injection 1 mg (1 mg Intramuscular Given 12/10/20 1834)    ED Course  I have reviewed the triage vital signs and the nursing notes.  Pertinent labs & imaging results that were available during my care of the patient were reviewed by me and considered in my medical decision making (see chart for details).    MDM Rules/Calculators/A&P                          Ms. Pomplun presents with severe, acute low back pain without concern for neurologic emergency.  She achieved good pain relief in the emergency department and was counseled on symptomatic management.  She does plan to see her primary care physician as soon as possible for referral to physical therapy.  She was given return precautions. Final Clinical Impression(s) / ED Diagnoses Final diagnoses:  Acute bilateral low back pain without sciatica    Rx / DC Orders ED Discharge Orders         Ordered    ketorolac (TORADOL) 10 MG tablet  Every 6 hours PRN        12/10/20 1950           Koleen Distance,  MD 12/10/20 1954

## 2024-03-29 ENCOUNTER — Emergency Department (HOSPITAL_BASED_OUTPATIENT_CLINIC_OR_DEPARTMENT_OTHER)
Admission: EM | Admit: 2024-03-29 | Discharge: 2024-03-29 | Disposition: A | Discharge status: Home / Self Care | Attending: Emergency Medicine | Admitting: Emergency Medicine

## 2024-03-29 ENCOUNTER — Emergency Department (HOSPITAL_BASED_OUTPATIENT_CLINIC_OR_DEPARTMENT_OTHER)

## 2024-03-29 ENCOUNTER — Other Ambulatory Visit: Payer: Self-pay

## 2024-03-29 ENCOUNTER — Encounter (HOSPITAL_BASED_OUTPATIENT_CLINIC_OR_DEPARTMENT_OTHER): Payer: Self-pay

## 2024-03-29 DIAGNOSIS — R1084 Generalized abdominal pain: Secondary | ICD-10-CM | POA: Diagnosis not present

## 2024-03-29 DIAGNOSIS — Y92410 Unspecified street and highway as the place of occurrence of the external cause: Secondary | ICD-10-CM | POA: Diagnosis not present

## 2024-03-29 DIAGNOSIS — M545 Low back pain, unspecified: Secondary | ICD-10-CM | POA: Diagnosis not present

## 2024-03-29 DIAGNOSIS — T700XXA Otitic barotrauma, initial encounter: Secondary | ICD-10-CM | POA: Diagnosis not present

## 2024-03-29 DIAGNOSIS — H748X2 Other specified disorders of left middle ear and mastoid: Secondary | ICD-10-CM | POA: Insufficient documentation

## 2024-03-29 DIAGNOSIS — S40012A Contusion of left shoulder, initial encounter: Secondary | ICD-10-CM | POA: Insufficient documentation

## 2024-03-29 DIAGNOSIS — R519 Headache, unspecified: Secondary | ICD-10-CM | POA: Diagnosis not present

## 2024-03-29 DIAGNOSIS — S161XXA Strain of muscle, fascia and tendon at neck level, initial encounter: Secondary | ICD-10-CM | POA: Insufficient documentation

## 2024-03-29 DIAGNOSIS — R7401 Elevation of levels of liver transaminase levels: Secondary | ICD-10-CM | POA: Insufficient documentation

## 2024-03-29 DIAGNOSIS — N3000 Acute cystitis without hematuria: Secondary | ICD-10-CM | POA: Insufficient documentation

## 2024-03-29 LAB — COMPREHENSIVE METABOLIC PANEL WITH GFR
ALT: 377 U/L — ABNORMAL HIGH (ref 0–44)
AST: 289 U/L — ABNORMAL HIGH (ref 15–41)
Albumin: 3.1 g/dL — ABNORMAL LOW (ref 3.5–5.0)
Alkaline Phosphatase: 70 U/L (ref 38–126)
Anion gap: 5 (ref 5–15)
BUN: 13 mg/dL (ref 6–20)
CO2: 24 mmol/L (ref 22–32)
Calcium: 8.5 mg/dL — ABNORMAL LOW (ref 8.9–10.3)
Chloride: 102 mmol/L (ref 98–111)
Creatinine, Ser: 0.83 mg/dL (ref 0.44–1.00)
GFR, Estimated: >60 mL/min (ref >60)
Glucose, Bld: 99 mg/dL (ref 70–99)
Potassium: 3.8 mmol/L (ref 3.5–5.1)
Sodium: 131 mmol/L — ABNORMAL LOW (ref 135–145)
Total Bilirubin: 1.3 mg/dL — ABNORMAL HIGH (ref 0.0–1.2)
Total Protein: 11.5 g/dL — ABNORMAL HIGH (ref 6.5–8.1)

## 2024-03-29 LAB — CBC WITH DIFFERENTIAL/PLATELET
Abs Immature Granulocytes: 0.02 K/uL (ref 0.00–0.07)
Basophils Absolute: 0 K/uL (ref 0.0–0.1)
Basophils Relative: 1 %
Eosinophils Absolute: 0.1 K/uL (ref 0.0–0.5)
Eosinophils Relative: 3 %
HCT: 35.6 % — ABNORMAL LOW (ref 36.0–46.0)
Hemoglobin: 12 g/dL (ref 12.0–15.0)
Immature Granulocytes: 0 %
Lymphocytes Relative: 38 %
Lymphs Abs: 2 K/uL (ref 0.7–4.0)
MCH: 30.5 pg (ref 26.0–34.0)
MCHC: 33.7 g/dL (ref 30.0–36.0)
MCV: 90.4 fL (ref 80.0–100.0)
Monocytes Absolute: 0.6 K/uL (ref 0.1–1.0)
Monocytes Relative: 11 %
Neutro Abs: 2.4 K/uL (ref 1.7–7.7)
Neutrophils Relative %: 47 %
Platelets: 243 K/uL (ref 150–400)
RBC: 3.94 MIL/uL (ref 3.87–5.11)
RDW: 13.4 % (ref 11.5–15.5)
WBC: 5.1 K/uL (ref 4.0–10.5)
nRBC: 0 % (ref 0.0–0.2)

## 2024-03-29 LAB — URINALYSIS, ROUTINE W REFLEX MICROSCOPIC
Bilirubin Urine: NEGATIVE
Glucose, UA: NEGATIVE mg/dL
Hgb urine dipstick: NEGATIVE
Ketones, ur: NEGATIVE mg/dL
Nitrite: NEGATIVE
Protein, ur: NEGATIVE mg/dL
Specific Gravity, Urine: 1.02 (ref 1.005–1.030)
pH: 5.5 (ref 5.0–8.0)

## 2024-03-29 LAB — URINALYSIS, MICROSCOPIC (REFLEX)

## 2024-03-29 LAB — PREGNANCY, URINE: Preg Test, Ur: NEGATIVE

## 2024-03-29 MED ORDER — MORPHINE SULFATE (PF) 4 MG/ML IV SOLN
4.0000 mg | Freq: Once | INTRAVENOUS | Status: AC
  Administered 2024-03-29: 4 mg via INTRAVENOUS
  Filled 2024-03-29: qty 1

## 2024-03-29 MED ORDER — OXYCODONE-ACETAMINOPHEN 5-325 MG PO TABS: 1.0000 | ORAL_TABLET | Freq: Four times a day (QID) | ORAL | 0 refills | Status: AC | PRN

## 2024-03-29 MED ORDER — IBUPROFEN 600 MG PO TABS
600.0000 mg | ORAL_TABLET | Freq: Four times a day (QID) | ORAL | 0 refills | Status: AC | PRN
Start: 2024-03-29 — End: ?

## 2024-03-29 MED ORDER — SODIUM CHLORIDE 0.9% FLUSH
3.0000 mL | Freq: Two times a day (BID) | INTRAVENOUS | Status: DC
  Administered 2024-03-29: 3 mL via INTRAVENOUS
  Filled 2024-03-29: qty 3

## 2024-03-29 MED ORDER — SODIUM CHLORIDE 0.9 % IV SOLN
250.0000 mL | INTRAVENOUS | Status: DC | PRN
  Administered 2024-03-29: 250 mL via INTRAVENOUS

## 2024-03-29 MED ORDER — CEPHALEXIN 500 MG PO CAPS
500.0000 mg | ORAL_CAPSULE | Freq: Four times a day (QID) | ORAL | 0 refills | Status: AC
Start: 2024-03-29 — End: 2024-04-05

## 2024-03-29 MED ORDER — SODIUM CHLORIDE 0.9% FLUSH
3.0000 mL | INTRAVENOUS | Status: DC | PRN
  Filled 2024-03-29: qty 3

## 2024-03-29 MED ORDER — ONDANSETRON HCL 4 MG/2ML IJ SOLN
4.0000 mg | Freq: Once | INTRAMUSCULAR | Status: AC
  Administered 2024-03-29: 4 mg via INTRAVENOUS
  Filled 2024-03-29: qty 2

## 2024-03-29 MED ORDER — IOHEXOL 300 MG/ML  SOLN
100.0000 mL | Freq: Once | INTRAMUSCULAR | Status: AC | PRN
Start: 2024-03-29 — End: 2024-03-29
  Administered 2024-03-29: 100 mL via INTRAVENOUS
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
## 2024-07-14 ENCOUNTER — Telehealth (HOSPITAL_COMMUNITY): Payer: Self-pay | Admitting: *Deleted

## 2024-07-14 NOTE — Telephone Encounter (Signed)
 Patient called today approx 145 pm wanting to know day hospital holiday schedule/hours of operation.  Patient states she has been falling a lot lately and has bruising on her left side .  I advised patient she should be seen by urgent care or ED for acute possible injuries from fall but patient declined numerous times when advised to seek treatment.  I spoke with provider and we both agree that this is something that should be handled acutely not waiting for day hospital (sickle cell) clinics hours to accommodate because it is not sickle cell crisis it and patient states these are fall issues.  I once again tried to encourage patient to seek treatment for acute injuries she states sustained from fall as it is important not to delay treatment/care.  Patient states she understands but declines to  seek help other than here.
